# Patient Record
Sex: Male | Born: 2000 | Race: White | Hispanic: Yes | Marital: Single
Health system: Southern US, Community
[De-identification: ages and names within clinical notes are randomized; demographics above are authoritative.]

---
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
## 2023-02-21 ENCOUNTER — Emergency Department (HOSPITAL_COMMUNITY)

## 2023-02-21 ENCOUNTER — Emergency Department (HOSPITAL_COMMUNITY)
Admission: EM | Admit: 2023-02-21 | Discharge: 2023-02-21 | Disposition: A | Discharge status: Home / Self Care | Attending: Emergency Medicine | Admitting: Emergency Medicine

## 2023-02-21 DIAGNOSIS — S0001XA Abrasion of scalp, initial encounter: Secondary | ICD-10-CM | POA: Insufficient documentation

## 2023-02-21 DIAGNOSIS — R55 Syncope and collapse: Secondary | ICD-10-CM | POA: Insufficient documentation

## 2023-02-21 DIAGNOSIS — R569 Unspecified convulsions: Secondary | ICD-10-CM | POA: Diagnosis present

## 2023-02-21 DIAGNOSIS — X58XXXA Exposure to other specified factors, initial encounter: Secondary | ICD-10-CM | POA: Diagnosis not present

## 2023-02-21 LAB — BASIC METABOLIC PANEL
Anion gap: 9 (ref 5–15)
BUN: 14 mg/dL (ref 6–20)
CO2: 25 mmol/L (ref 22–32)
Calcium: 9.3 mg/dL (ref 8.9–10.3)
Chloride: 102 mmol/L (ref 98–111)
Creatinine, Ser: 1.12 mg/dL (ref 0.61–1.24)
GFR, Estimated: >60 mL/min (ref >60)
Glucose, Bld: 95 mg/dL (ref 70–99)
Potassium: 3.8 mmol/L (ref 3.5–5.1)
Sodium: 136 mmol/L (ref 135–145)

## 2023-02-21 LAB — CBC
HCT: 43.3 % (ref 39.0–52.0)
Hemoglobin: 14.3 g/dL (ref 13.0–17.0)
MCH: 29.9 pg (ref 26.0–34.0)
MCHC: 33 g/dL (ref 30.0–36.0)
MCV: 90.6 fL (ref 80.0–100.0)
Platelets: 328 10*3/uL (ref 150–400)
RBC: 4.78 MIL/uL (ref 4.22–5.81)
RDW: 12.1 % (ref 11.5–15.5)
WBC: 8.3 10*3/uL (ref 4.0–10.5)
nRBC: 0 % (ref 0.0–0.2)

## 2023-02-21 LAB — CBG MONITORING, ED: Glucose-Capillary: 106 mg/dL — ABNORMAL HIGH (ref 70–99)

## 2023-02-21 MED ORDER — SODIUM CHLORIDE 0.9 % IV BOLUS
1000.0000 mL | Freq: Once | INTRAVENOUS | Status: AC
  Administered 2023-02-21: 1000 mL via INTRAVENOUS

## 2023-02-21 MED ORDER — OXYCODONE-ACETAMINOPHEN 5-325 MG PO TABS
2.0000 | ORAL_TABLET | Freq: Once | ORAL | Status: AC
  Administered 2023-02-21: 2 via ORAL
  Filled 2023-02-21: qty 2

## 2023-02-21 MED ORDER — HYDROCODONE-ACETAMINOPHEN 5-325 MG PO TABS: 2.0000 | ORAL_TABLET | Freq: Once | ORAL | Status: DC
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
## 2023-02-21 NOTE — Discharge Instructions (Signed)
You likely have near syncope or seizure.   Don't drive until you see neurology   See your doctor. Take your pain meds as prescribed by your doctor   Return to ER if you have another seizure or passing out
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
